# Patient Record
Sex: Female | Born: 1955 | Race: White | Hispanic: No | Marital: Married | Smoking: Former smoker
Health system: Southern US, Community
[De-identification: ages and names within clinical notes are randomized; demographics above are authoritative.]

## PROBLEM LIST (undated history)

## (undated) DIAGNOSIS — E119 Type 2 diabetes mellitus without complications: Secondary | ICD-10-CM

## (undated) HISTORY — DX: Type 2 diabetes mellitus without complications: E11.9

---

## 2018-04-16 ENCOUNTER — Ambulatory Visit (HOSPITAL_COMMUNITY)
Admission: EM | Admit: 2018-04-16 | Discharge: 2018-04-16 | Disposition: A | Discharge status: Home / Self Care | Payer: 59 | Attending: Family Medicine | Admitting: Family Medicine

## 2018-04-16 ENCOUNTER — Encounter (HOSPITAL_COMMUNITY): Payer: Self-pay

## 2018-04-16 DIAGNOSIS — J40 Bronchitis, not specified as acute or chronic: Secondary | ICD-10-CM | POA: Diagnosis not present

## 2018-04-16 MED ORDER — PREDNISONE 20 MG PO TABS: ORAL_TABLET | ORAL | 1 refills | Status: DC

## 2018-04-16 MED ORDER — BENZONATATE 100 MG PO CAPS: 100.0000 mg | ORAL_CAPSULE | Freq: Three times a day (TID) | ORAL | 0 refills | Status: DC | PRN

## 2018-04-16 NOTE — ED Provider Notes (Signed)
[facility]    CSN: [id number] Arrival date & time: 04/16/18  1358     History   Chief Complaint Chief Complaint  Patient presents with  . Cough    HPI Danielle Richards is a 62 y.o. female.   This is the initial urgent care visit for this 62 year old woman who has had a dry cough for over a week.  [place] in the [place] when this [place].  She has no history of asthma or smoking.  There is been no fever.     History reviewed. No pertinent past medical history.  There are no active problems to display for this patient.   Past Surgical History:  Procedure Laterality Date  . CESAREAN SECTION      OB History   None      Home Medications    Prior to Admission medications   Medication Sig Start Date End Date Taking? Authorizing Provider  benzonatate (TESSALON) 100 MG capsule Take 1-2 capsules (100-200 mg total) by mouth 3 (three) times daily as needed for cough. 04/16/18   Elvina SidleLauenstein, Alaya Iverson, MD  predniSONE (DELTASONE) 20 MG tablet One daily with food 04/16/18   Elvina SidleLauenstein, Tery Hoeger, MD    Family History Family History  Family history unknown: Yes    Social History Social History   Tobacco Use  . Smoking status: Never Smoker  . Smokeless tobacco: Never Used  Substance Use Topics  . Alcohol use: Yes    Alcohol/week: 1.0 standard drinks    Types: 1 Glasses of wine per week    Comment: occasional   . Drug use: Not Currently     Allergies   Patient has no allergy information on record.   Review of Systems Review of Systems   Physical Exam Triage Vital Signs ED Triage Vitals  Enc Vitals Group     BP 04/16/18 1523 (!) 146/82     Pulse Rate 04/16/18 1523 86     Resp 04/16/18 1523 18     Temp 04/16/18 1523 97.8 F (36.6 C)     Temp Source 04/16/18 1523 Oral     SpO2 04/16/18 1523 97 %     Weight --      Height --      Head Circumference --      Peak Flow --      Pain Score 04/16/18 1524 0     Pain Loc --      Pain  Edu? --      Excl. in GC? --    No data found.  Updated Vital Signs BP (!) 146/82 (BP Location: Right Arm)   Pulse 86   Temp 97.8 F (36.6 C) (Oral)   Resp 18   SpO2 97%   Visual Acuity  Physical Exam  Constitutional: She is oriented to person, place, and time. She appears well-developed and well-nourished.  HENT:  Right Ear: External ear normal.  Left Ear: External ear normal.  Mouth/Throat: Oropharynx is clear and moist.  Eyes: Conjunctivae are normal.  Neck: Normal range of motion. Neck supple.  Cardiovascular: Normal rate, regular rhythm and normal heart sounds.  Pulmonary/Chest: Effort normal and breath sounds normal.  Musculoskeletal: Normal range of motion.  Neurological: She is alert and oriented to person, place, and time.  Skin: Skin is warm and dry.  Xanthoma below eyes  Psychiatric: She has a normal mood and affect. Her behavior is normal.  Nursing note and vitals reviewed.    UC Treatments /  Results  Labs (all labs ordered are listed, but only abnormal results are displayed) Labs Reviewed - No data to display  EKG None  Radiology No results found.  Procedures Procedures (including critical care time)  Medications Ordered in UC Medications - No data to display  Initial Impression / Assessment and Plan / UC Course  I have reviewed the triage vital signs and the nursing notes.  Pertinent labs & imaging results that were available during my care of the patient were reviewed by me and considered in my medical decision making (see chart for details).     Final Clinical Impressions(s) / UC Diagnoses   Final diagnoses:  Bronchitis     Discharge Instructions     The cough should resolve in 2 days with these medications.  Usually, one takes the medicine for 5 days to completely stop the problem    ED Prescriptions    Medication Sig Dispense Auth. Provider   predniSONE (DELTASONE) 20 MG tablet One daily with food 5 tablet Elvina Sidle, MD    benzonatate (TESSALON) 100 MG capsule Take 1-2 capsules (100-200 mg total) by mouth 3 (three) times daily as needed for cough. 21 capsule Elvina Sidle, MD     Controlled Substance Prescriptions [place] Controlled Substance Registry consulted? Not Applicable   Elvina Sidle, MD 04/16/18 1546

## 2018-04-16 NOTE — ED Triage Notes (Signed)
Pt Present dry cough for a week.

## 2018-04-16 NOTE — Discharge Instructions (Signed)
The cough should resolve in 2 days with these medications.  Usually, one takes the medicine for 5 days to completely stop the problem

## 2019-05-16 ENCOUNTER — Encounter: Payer: 59 | Attending: Family Medicine | Admitting: *Deleted

## 2019-05-16 ENCOUNTER — Other Ambulatory Visit: Payer: Self-pay

## 2019-05-16 DIAGNOSIS — E119 Type 2 diabetes mellitus without complications: Secondary | ICD-10-CM | POA: Diagnosis not present

## 2019-05-16 NOTE — Patient Instructions (Signed)
Plan:   Aim for 2 Carb Choices per meal (30 grams) +/- 1 either way   Aim for 0-1 Carbs per snack if hungry   Include protein in moderation with your meals and snacks  Continue reading food labels for Total Carbohydrate of foods  Continue with your activity level by walking or biking for 60 minutes daily as tolerated  Continue checking BG at alternate times per day   We discussed the possible advantages of using a CGM such as Libre or Dexcom in place of your meter  Continue taking medication as directed by MD

## 2019-05-16 NOTE — Progress Notes (Signed)
Diabetes Self-Management Education  Visit Type: First/Initial  Appt. Start Time: 0800 Appt. End Time: 0930  05/16/2019  [name], identified by name and date of birth, is a 63 y.o. female with a diagnosis of Diabetes: Type 2. She is newly diagnosed and is surprised as she has not been ill as an adult and is not on any prescription medications until starting on insulin. She is very active biking and hiking most of her adult life. She typically eats whole grain and natural foods and her beverages do not contain any sugar. She is here to learn more about diabetes and specifically to food guidelines.   ASSESSMENT  There were no vitals taken for this visit. There is no height or weight on file to calculate BMI.  Diabetes Self-Management Education - 05/16/19 0817      Visit Information   Visit Type  First/Initial      Initial Visit   Diabetes Type  Type 2    Are you currently following a meal plan?  No    Are you taking your medications as prescribed?  Yes    Date Diagnosed  02/2019      Health Coping   How would you rate your overall health?  Good      Psychosocial Assessment   Patient Belief/Attitude about Diabetes  Motivated to manage diabetes    Self-care barriers  None    Other persons present  Patient    Patient Concerns  Nutrition/Meal planning;Glycemic Control;Problem Solving    Special Needs  None    Preferred Learning Style  Auditory;Visual;Hands on    Learning Readiness  Change in progress    How often do you need to have someone help you when you read instructions, pamphlets, or other written materials from your doctor or pharmacy?  1 - Never    What is the last grade level you completed in school?  Bachelors, retired      Teacher, musicre-Education Assessment   Patient understands incorporating nutritional management into lifestyle.  Needs Instruction    Patient undertands incorporating physical activity into lifestyle.  Needs Instruction    Patient understands  using medications safely.  Needs Instruction    Patient understands monitoring blood glucose, interpreting and using results  Needs Instruction    Patient understands prevention, detection, and treatment of acute complications.  Needs Instruction    Patient understands prevention, detection, and treatment of chronic complications.  Needs Instruction    Patient understands how to develop strategies to address psychosocial issues.  Needs Instruction    Patient understands how to develop strategies to promote health/change behavior.  Needs Instruction      Complications   Last HgB A1C per patient/outside source  12.6 %   improving since diagnosed an taking insulin   How often do you check your blood sugar?  1-2 times/day    Fasting Blood glucose range (mg/dL)  16-109;[id number];130-179    Postprandial Blood glucose range (mg/dL)  981-191;[id number];180-200    Number of hypoglycemic episodes per month  0    Have you had a dilated eye exam in the past 12 months?  No    Have you had a dental exam in the past 12 months?  Yes    Are you checking your feet?  Yes    How many days per week are you checking your feet?  5      Dietary Intake   Breakfast  eggs with whole grain toast often OR oatmeal with raisins  and nuts OR left over dinner as the AM meal    Lunch  snacks on salmon with soft cheese on crackers OR    Dinner  3 - 4 PM: meat maybe once a week, mostly chicken or fish, mixes of vegetables, quinoa and beans,  OR large salad with toast or protein added    Snack (evening)  6:30 snack of nuts OR quinoa chips OR low carb crackers    Beverage(s)  almond milk      Exercise   Exercise Type  Moderate (swimming / aerobic walking)    How many days per week to you exercise?  4    How many minutes per day do you exercise?  90    Total minutes per week of exercise  360      Patient Education   Previous Diabetes Education  No    Disease state   Definition of diabetes, type 1 and 2, and the diagnosis of diabetes;Factors  that contribute to the development of diabetes    Nutrition management   Role of diet in the treatment of diabetes and the relationship between the three main macronutrients and blood glucose level;Food label reading, portion sizes and measuring food.;Carbohydrate counting;Reviewed blood glucose goals for pre and post meals and how to evaluate the patients' food intake on their blood glucose level.    Physical activity and exercise   Helped patient identify appropriate exercises in relation to his/her diabetes, diabetes complications and other health issue.;Role of exercise on diabetes management, blood pressure control and cardiac health.    Medications  Reviewed patients medication for diabetes, action, purpose, timing of dose and side effects.    Chronic complications  Relationship between chronic complications and blood glucose control    Psychosocial adjustment  Role of stress on diabetes      Individualized Goals (developed by patient)   Nutrition  Follow meal plan discussed    Physical Activity  Exercise 3-5 times per week    Medications  take my medication as prescribed    Monitoring   test blood glucose pre and post meals as discussed      Post-Education Assessment   Patient understands the diabetes disease and treatment process.  Demonstrates understanding / competency    Patient understands incorporating nutritional management into lifestyle.  Demonstrates understanding / competency    Patient undertands incorporating physical activity into lifestyle.  Demonstrates understanding / competency    Patient understands using medications safely.  Demonstrates understanding / competency    Patient understands monitoring blood glucose, interpreting and using results  Demonstrates understanding / competency    Patient understands prevention, detection, and treatment of acute complications.  Demonstrates understanding / competency    Patient understands prevention, detection, and treatment of  chronic complications.  Demonstrates understanding / competency    Patient understands how to develop strategies to address psychosocial issues.  Demonstrates understanding / competency    Patient understands how to develop strategies to promote health/change behavior.  Demonstrates understanding / competency      Outcomes   Expected Outcomes  Demonstrated interest in learning. Expect positive outcomes    Future DMSE  PRN    Program Status  Not Completed       Individualized Plan for Diabetes Self-Management Training:   Learning Objective:  Patient will have a greater understanding of diabetes self-management. Patient education plan is to attend individual and/or group sessions per assessed needs and concerns.   Plan:   Patient Instructions  Plan:  Aim for 2 Carb Choices per meal (30 grams) +/- 1 either way   Aim for 0-1 Carbs per snack if hungry   Include protein in moderation with your meals and snacks  Continue reading food labels for Total Carbohydrate of foods  Continue with your activity level by walking or biking for 60 minutes daily as tolerated  Continue checking BG at alternate times per day   We discussed the possible advantages of using a CGM such as Libre or Dexcom in place of your meter  Continue taking medication as directed by MD  Expected Outcomes:  Demonstrated interest in learning. Expect positive outcomes  Education material provided: Food label handouts, A1C conversion sheet, Meal plan card and Carbohydrate counting sheet  If problems or questions, patient to contact team via:  Phone  Future DSME appointment: PRN

## 2019-10-04 ENCOUNTER — Telehealth: Payer: Self-pay | Admitting: Hematology

## 2019-10-04 NOTE — Telephone Encounter (Signed)
Received a new hem referral from Dr. Talmage Nap for polycthemia. Danielle Richards has been cld and scheduled to see Dr. Mosetta Putt on 5/25 at 8am. Pt aware to arrive 15 minutes early.

## 2019-10-05 NOTE — Progress Notes (Signed)
[facility]   Telephone:([phone]) [phone] [phone]) [phone]   Clinic New Consult Note   Patient Care Team: Tally Joe, MD as PCP - General (Family Medicine)  Date of Service:  10/09/2019   CHIEF COMPLAINTS/PURPOSE OF CONSULTATION:  Mild erythrocytosis  REFERRING PHYSICIAN:  Dr Talmage Nap  HISTORY OF PRESENTING ILLNESS:  Danielle Richards 64 y.o. female is a here because of slightly elevated hemoglobin and hematocrit on routine lab. The patient was referred by Dr Talmage Nap The patient presents to the clinic today alone.  She notes she has labs checked as needed in recent years. She was seeing Dr Talmage Nap for her DM. Her recent labs show elevated red blood counts. She notes her last period was when she was 64 years old and she was anemic with her pregnancy in the past.  Today she denies any issues with her chest, breathing, abdomen, eating, or Bowel movement. She denies itching of skin after hot shower and will get redness of her cheeks randomly, not daily. She denies headaches or vision issues. She notes being mildly fatigued lately. She notes stress, but due to dealing with her deceased mother's estate.   Socially she moved to [place] from [place] where she lived for 9 years.  Her older records would be with her PCP and Dr Talmage Nap. She is married with 4 children. She had 2 miscarriages in 2nd trimester. She is retired from Consulting civil engineer. She has other family who live in [place]. She drinks rarely. She smoked for 5 years a few cigarettes a week.   They have a PMHx of Hepatitis A in her 38s. She denies being tested for Hep B or C. She also has DM. She only had a C-section. She denies family history of blood disorders. Her mother had lung cancer and her father had skin melanoma. She notes her mother had blood counts in her legs and required IVC Filter.    REVIEW OF SYSTEMS:   Constitutional: Denies fevers, chills or abnormal night sweats Eyes: Denies blurriness of vision, double vision or watery eyes  Ears, nose, mouth, throat, and face: Denies mucositis or sore throat Respiratory: Denies cough, dyspnea or wheezes Cardiovascular: Denies palpitation, chest discomfort or lower extremity swelling Gastrointestinal:  Denies nausea, heartburn or change in bowel habits Skin: Denies abnormal skin rashes Lymphatics: Denies new lymphadenopathy or easy bruising Neurological:Denies numbness, tingling or new weaknesses Behavioral/Psych: Richards is stable, no new changes  All other systems were reviewed with the patient and are negative.   MEDICAL HISTORY:  Past Medical History:  Diagnosis Date  . Diabetes mellitus without complication (HCC)     SURGICAL HISTORY: Past Surgical History:  Procedure Laterality Date  . CESAREAN SECTION      SOCIAL HISTORY: Social History   Socioeconomic History  . Marital status: Married    Spouse name: Not on file  . Number of children: 4  . Years of education: Not on file  . Highest education level: Not on file  Occupational History  . Occupation: retired Consulting civil engineer   Tobacco Use  . Smoking status: Former Smoker    Packs/day: 0.25    Years: 5.00    Pack years: 1.25  . Smokeless tobacco: Never Used  Substance and Sexual Activity  . Alcohol use: Yes    Alcohol/week: 1.0 standard drinks    Types: 1 Glasses of wine per week    Comment: occasional   . Drug use: Not Currently  . Sexual activity: Yes  Other Topics Concern  . Not  on file  Social History Narrative  . Not on file   Social Determinants of Health   Financial Resource Strain:   . Difficulty of Paying Living Expenses:   Food Insecurity:   . Worried About Programme researcher, broadcasting/film/video in the Last Year:   . Barista in the Last Year:   Transportation Needs:   . Freight forwarder (Medical):   Marland Kitchen Lack of Transportation (Non-Medical):   Physical Activity:   . Days of Exercise per Week:   . Minutes of Exercise per Session:   Stress:   . Feeling of Stress :   Social Connections:   .  Frequency of Communication with Friends and Family:   . Frequency of Social Gatherings with Friends and Family:   . Attends Religious Services:   . Active Member of Clubs or Organizations:   . Attends Banker Meetings:   Marland Kitchen Marital Status:   Intimate Partner Violence:   . Fear of Current or Ex-Partner:   . Emotionally Abused:   Marland Kitchen Physically Abused:   . Sexually Abused:     FAMILY HISTORY: Family History  Problem Relation Age of Onset  . Cancer Mother        lung cancer  . Cancer Father        melanoma     ALLERGIES:  has No Known Allergies.  MEDICATIONS:  Current Outpatient Medications  Medication Sig Dispense Refill  . metFORMIN (GLUCOPHAGE-XR) 500 MG 24 hr tablet Take 500 mg by mouth 2 (two) times daily.    . Vitamin D, Ergocalciferol, (DRISDOL) 1.25 MG (50000 UNIT) CAPS capsule Take 50,000 Units by mouth at bedtime.     No current facility-administered medications for this visit.    PHYSICAL EXAMINATION: ECOG PERFORMANCE STATUS: 0 - Asymptomatic  Vitals:   10/09/19 0824 10/09/19 0826  BP: (!) 148/83 136/87  Pulse: 72   Resp: 18   Temp: 97.7 F (36.5 C)   SpO2: 100%    Filed Weights   10/09/19 0824  Weight: 165 lb 11.2 oz (75.2 kg)    GENERAL:alert, no distress and comfortable SKIN: skin color, texture, turgor are normal, no rashes or significant lesions EYES: normal, Conjunctiva are pink and non-injected, sclera clear  NECK: supple, thyroid normal size, non-tender, without nodularity LYMPH:  no palpable lymphadenopathy in the cervical, axillary  LUNGS: clear to auscultation and percussion with normal breathing effort HEART: regular rate & rhythm and no murmurs and no lower extremity edema ABDOMEN:abdomen soft, non-tender and normal bowel sounds. No organomegaly  Musculoskeletal:no cyanosis of digits and no clubbing  NEURO: alert & oriented x 3 with fluent speech, no focal motor/sensory deficits  LABORATORY DATA:  I have reviewed the data as  listed CBC Latest Ref Rng & Units 10/09/2019  WBC 4.0 - 10.5 K/uL 4.2  Hemoglobin 12.0 - 15.0 g/dL 15.1  Hematocrit 76.1 - 46.0 % 45.0  Platelets 150 - 400 K/uL 256    No flowsheet data found.   OUTSIDE LABS:   04/10/19  WBC 3.7 Hg 15.9 Hct 47.2 MCV 89.9 MCH 30.3 MCHC 33.7 plt 229K   08/28/19 WBC 4.5 HG 15.4 HCT 45.6  MCV 91.4 MCH 30.9 MCHC 33.8 PLT 251k   RADIOGRAPHIC STUDIES: I have personally reviewed the radiological images as listed and agreed with the findings in the report. No results found.  ASSESSMENT & PLAN:  Makinley Muscato is a 64 y.o. Caucasian female with a history of DM, Anxiety, Depression, Vit D  Deficiency.   1. Mild erythrocytosis, rule out polycythemia -Her labs from 03/2019 and 08/2019 show elevated Hg in 15.4-15.9 range and Hct 45-47%.  -I discussed with elevated red blood counts, polycythemia is probable although her H/H are not very high.  We discussed that the polycythemia is a benign blood disorder with thickening of the blood which can increase her rick of thrombosis including DVT, PE, MI or stroke.  -We reviewed the diagnosis of polycythemia vera, versus secondary polycythemia.  She does not have clinical suspicion for secondary polycythemia -I discussed treatment may include Phlebotomy to physically remove the blood or medicine to decrease bone marrow production of her blood cells directly such as Hydrea.  -I will repeat her CBC today, and also check erythropoietin level, and Jak2 mutations. Given her mild elevated levels, my suspicion for polycythemia vera is low.  If her erythropoietin level is normal, JAK2 mutation negative, this will rule out PV.  -I reviewed benefit and risk of daily baby aspirin, which I do not recommend if she does not have PV. -Physical exam unremarkable today, no splenomegaly.  -f/u as needed   2. DM -She is being seen by Dr Talmage Nap for management    3. Cancer screenings  -I encouraged her to continue age  appropriate cancer screenings. She has family hx of lung cancer and melanoma.  -She notes she has been doing mammograms every 3 years, Pap smear every 5 years and she has done cologuard, no colonoscopy.  -I recommend she does yearly mammograms and have at least 1 colonoscopy given her mother's h/o polyps. She is agreed. I will order mammogram to be done.  -Given she is postmenopausal I recommend Bone Density testing every few years. She can f/u with Dr Opal Sidles for this.    PLAN:  -Mammogram in 1-2 months  -Lab today for CBC, erythropoietin, and JAK2 mutation.  I will call her with lab results.  If the lab test are negative, no need to follow-up with me.   Orders Placed This Encounter  Procedures  . MM Digital Screening    Standing Status:   Future    Standing Expiration Date:   10/08/2020    Order Specific Question:   Reason for Exam (SYMPTOM  OR DIAGNOSIS REQUIRED)    Answer:   screening    Order Specific Question:   Preferred imaging location?    Answer:   [facility]  . CBC with Differential (Cancer Center Only)    Standing Status:   Future    Number of Occurrences:   1    Standing Expiration Date:   10/08/2020  . Erythropoietin    Standing Status:   Future    Number of Occurrences:   1    Standing Expiration Date:   10/08/2020  . Jak 2 Exon 12 ([facility])    Standing Status:   Future    Number of Occurrences:   1    Standing Expiration Date:   10/08/2020  . Jak 2 V617F ([facility])    Standing Status:   Future    Number of Occurrences:   1    Standing Expiration Date:   10/08/2020    All questions were answered. The patient knows to call the clinic with any problems, questions or concerns. The total time spent in the appointment was 35 minutes.     Danielle Mood, MD 10/09/2019 2:43 PM  I, Delphina Cahill, am acting as scribe for Danielle Mood, MD.   I have reviewed the above  documentation for accuracy and completeness, and I agree with the above.

## 2019-10-09 ENCOUNTER — Other Ambulatory Visit: Payer: Self-pay

## 2019-10-09 ENCOUNTER — Inpatient Hospital Stay: Payer: 59

## 2019-10-09 ENCOUNTER — Encounter: Payer: Self-pay | Admitting: Hematology

## 2019-10-09 ENCOUNTER — Inpatient Hospital Stay: Payer: 59 | Attending: Hematology | Admitting: Hematology

## 2019-10-09 VITALS — BP 136/87 | HR 72 | Temp 97.7°F | Resp 18 | Ht 66.0 in | Wt 165.7 lb

## 2019-10-09 DIAGNOSIS — Z808 Family history of malignant neoplasm of other organs or systems: Secondary | ICD-10-CM | POA: Insufficient documentation

## 2019-10-09 DIAGNOSIS — E119 Type 2 diabetes mellitus without complications: Secondary | ICD-10-CM | POA: Insufficient documentation

## 2019-10-09 DIAGNOSIS — D751 Secondary polycythemia: Secondary | ICD-10-CM | POA: Diagnosis not present

## 2019-10-09 DIAGNOSIS — Z87891 Personal history of nicotine dependence: Secondary | ICD-10-CM | POA: Diagnosis not present

## 2019-10-09 DIAGNOSIS — R5383 Other fatigue: Secondary | ICD-10-CM | POA: Insufficient documentation

## 2019-10-09 DIAGNOSIS — F418 Other specified anxiety disorders: Secondary | ICD-10-CM | POA: Insufficient documentation

## 2019-10-09 DIAGNOSIS — Z1231 Encounter for screening mammogram for malignant neoplasm of breast: Secondary | ICD-10-CM

## 2019-10-09 DIAGNOSIS — Z801 Family history of malignant neoplasm of trachea, bronchus and lung: Secondary | ICD-10-CM | POA: Diagnosis not present

## 2019-10-09 DIAGNOSIS — Z7984 Long term (current) use of oral hypoglycemic drugs: Secondary | ICD-10-CM | POA: Insufficient documentation

## 2019-10-09 DIAGNOSIS — E559 Vitamin D deficiency, unspecified: Secondary | ICD-10-CM | POA: Insufficient documentation

## 2019-10-09 LAB — CBC WITH DIFFERENTIAL (CANCER CENTER ONLY)
Abs Immature Granulocytes: 0.01 10*3/uL (ref 0.00–0.07)
Basophils Absolute: 0 10*3/uL (ref 0.0–0.1)
Basophils Relative: 1 %
Eosinophils Absolute: 0.1 10*3/uL (ref 0.0–0.5)
Eosinophils Relative: 3 %
HCT: 45 % (ref 36.0–46.0)
Hemoglobin: 14.9 g/dL (ref 12.0–15.0)
Immature Granulocytes: 0 %
Lymphocytes Relative: 36 %
Lymphs Abs: 1.5 10*3/uL (ref 0.7–4.0)
MCH: 30.7 pg (ref 26.0–34.0)
MCHC: 33.1 g/dL (ref 30.0–36.0)
MCV: 92.6 fL (ref 80.0–100.0)
Monocytes Absolute: 0.3 10*3/uL (ref 0.1–1.0)
Monocytes Relative: 8 %
Neutro Abs: 2.2 10*3/uL (ref 1.7–7.7)
Neutrophils Relative %: 52 %
Platelet Count: 256 10*3/uL (ref 150–400)
RBC: 4.86 MIL/uL (ref 3.87–5.11)
RDW: 11.6 % (ref 11.5–15.5)
WBC Count: 4.2 10*3/uL (ref 4.0–10.5)
nRBC: 0 % (ref 0.0–0.2)

## 2019-10-10 LAB — ERYTHROPOIETIN: Erythropoietin: 5.3 m[IU]/mL (ref 2.6–18.5)

## 2019-10-25 LAB — JAK2 (INCLUDING V617F AND EXON 12), MPL,& CALR W/RFL MPN PANEL (NGS)

## 2019-11-05 ENCOUNTER — Telehealth: Payer: Self-pay | Admitting: Emergency Medicine

## 2019-11-05 NOTE — Telephone Encounter (Addendum)
VM left for pt to call back   ----- Message from Malachy Mood, MD sent at 11/04/2019  8:02 AM EDT ----- Please let pt know her lab were normal, no concern for blood disorders, I will see her as needed in future. Sorry that it took a while to call her back.   Malachy Mood  11/04/2019

## 2019-11-06 ENCOUNTER — Telehealth: Payer: Self-pay | Admitting: Emergency Medicine

## 2019-11-06 NOTE — Telephone Encounter (Signed)
Previous note from dr.feng given to pt. Pt verbalized understanding of lab results.

## 2019-12-05 ENCOUNTER — Other Ambulatory Visit: Payer: Self-pay

## 2019-12-05 ENCOUNTER — Ambulatory Visit
Admission: RE | Admit: 2019-12-05 | Discharge: 2019-12-05 | Disposition: A | Discharge status: Home / Self Care | Payer: 59 | Source: Ambulatory Visit | Attending: Hematology | Admitting: Hematology

## 2019-12-05 DIAGNOSIS — Z1231 Encounter for screening mammogram for malignant neoplasm of breast: Secondary | ICD-10-CM

## 2020-03-05 ENCOUNTER — Other Ambulatory Visit: Payer: Self-pay | Admitting: Family Medicine

## 2020-03-05 DIAGNOSIS — M25511 Pain in right shoulder: Secondary | ICD-10-CM

## 2020-12-05 ENCOUNTER — Other Ambulatory Visit: Payer: Self-pay | Admitting: Family Medicine

## 2020-12-05 DIAGNOSIS — Z1231 Encounter for screening mammogram for malignant neoplasm of breast: Secondary | ICD-10-CM

## 2020-12-12 ENCOUNTER — Other Ambulatory Visit: Payer: Self-pay

## 2020-12-12 ENCOUNTER — Ambulatory Visit
Admission: RE | Admit: 2020-12-12 | Discharge: 2020-12-12 | Disposition: A | Discharge status: Home / Self Care | Payer: 59 | Source: Ambulatory Visit | Attending: Family Medicine | Admitting: Family Medicine

## 2020-12-12 DIAGNOSIS — Z1231 Encounter for screening mammogram for malignant neoplasm of breast: Secondary | ICD-10-CM

## 2021-03-20 ENCOUNTER — Other Ambulatory Visit: Payer: Self-pay | Admitting: Family Medicine

## 2021-03-20 DIAGNOSIS — E2839 Other primary ovarian failure: Secondary | ICD-10-CM

## 2021-06-12 IMAGING — MG DIGITAL SCREENING BILAT W/ CAD
4 series · 4 of 4 positions shown · non-contrast
Comparison: Previous exam(s).

CLINICAL DATA: Screening.

EXAM:
DIGITAL SCREENING BILATERAL MAMMOGRAM WITH CAD

[L MLO]
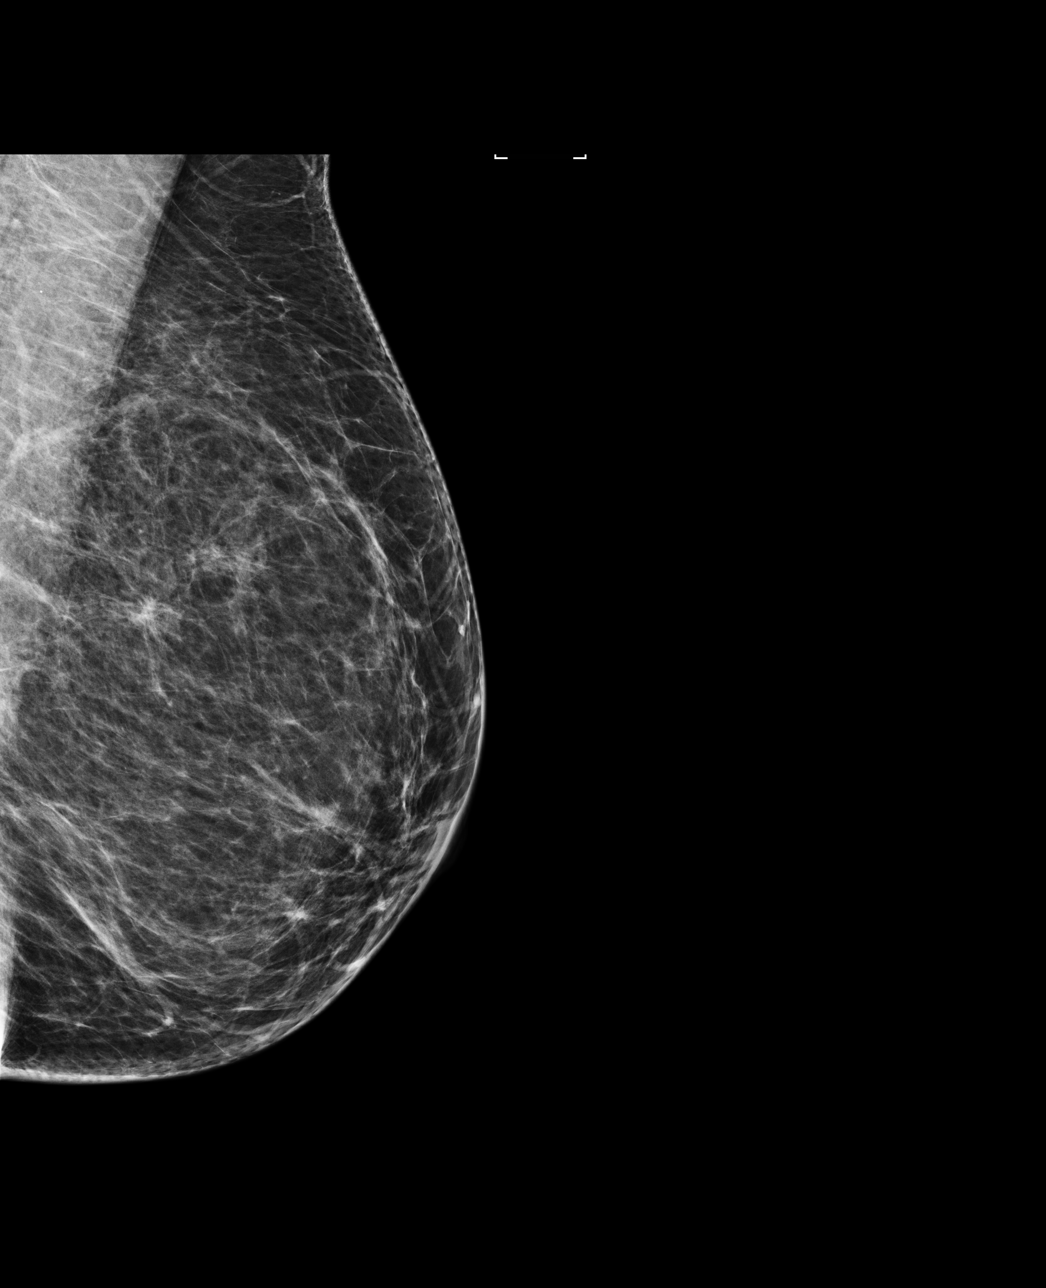

[R MLO]
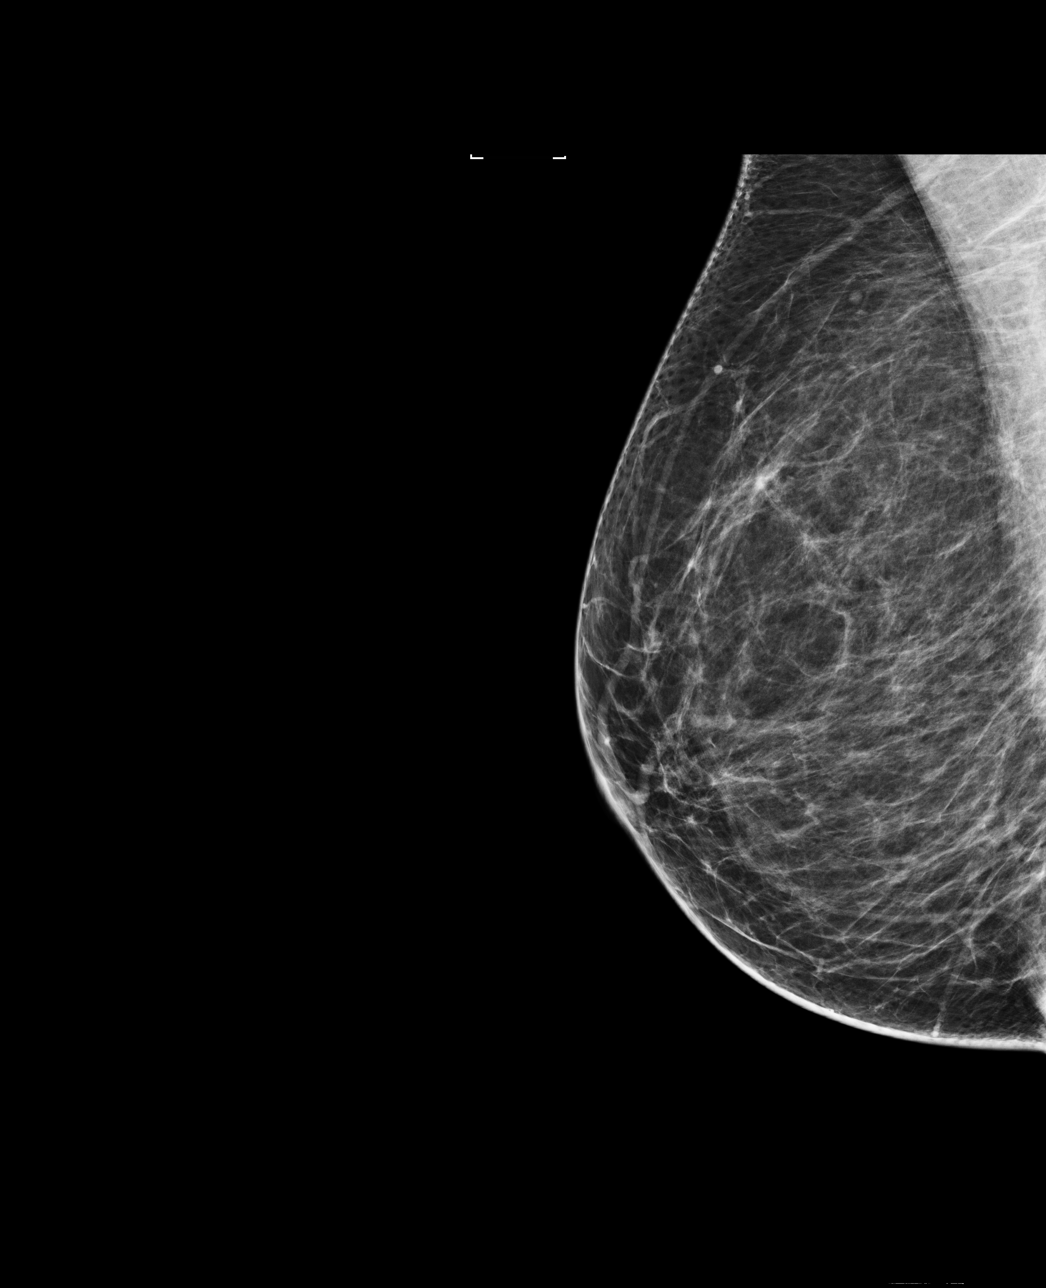

[R CC]
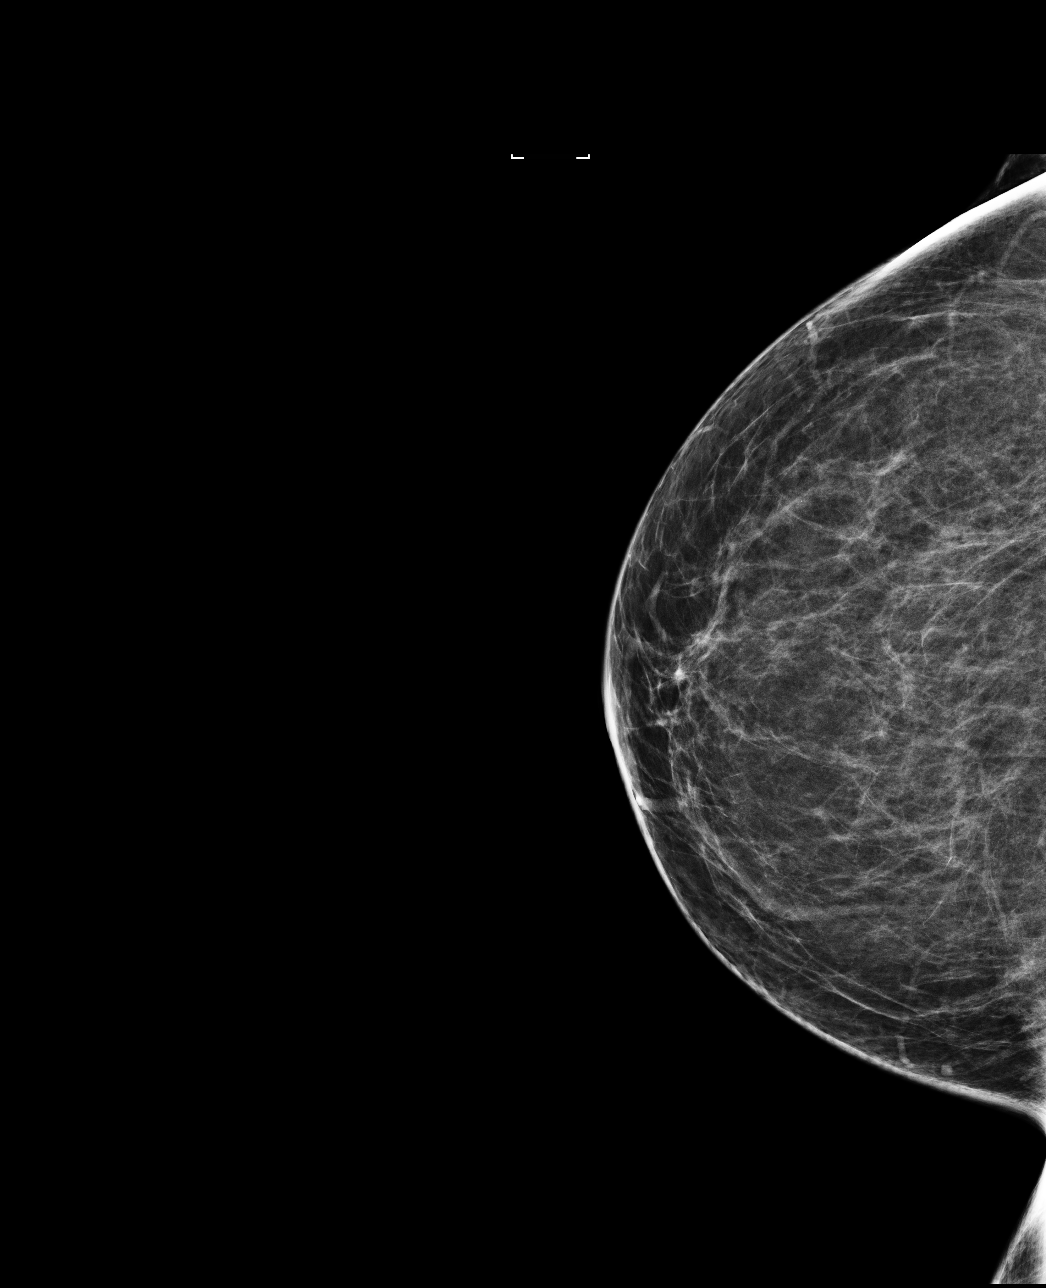

[L CC]
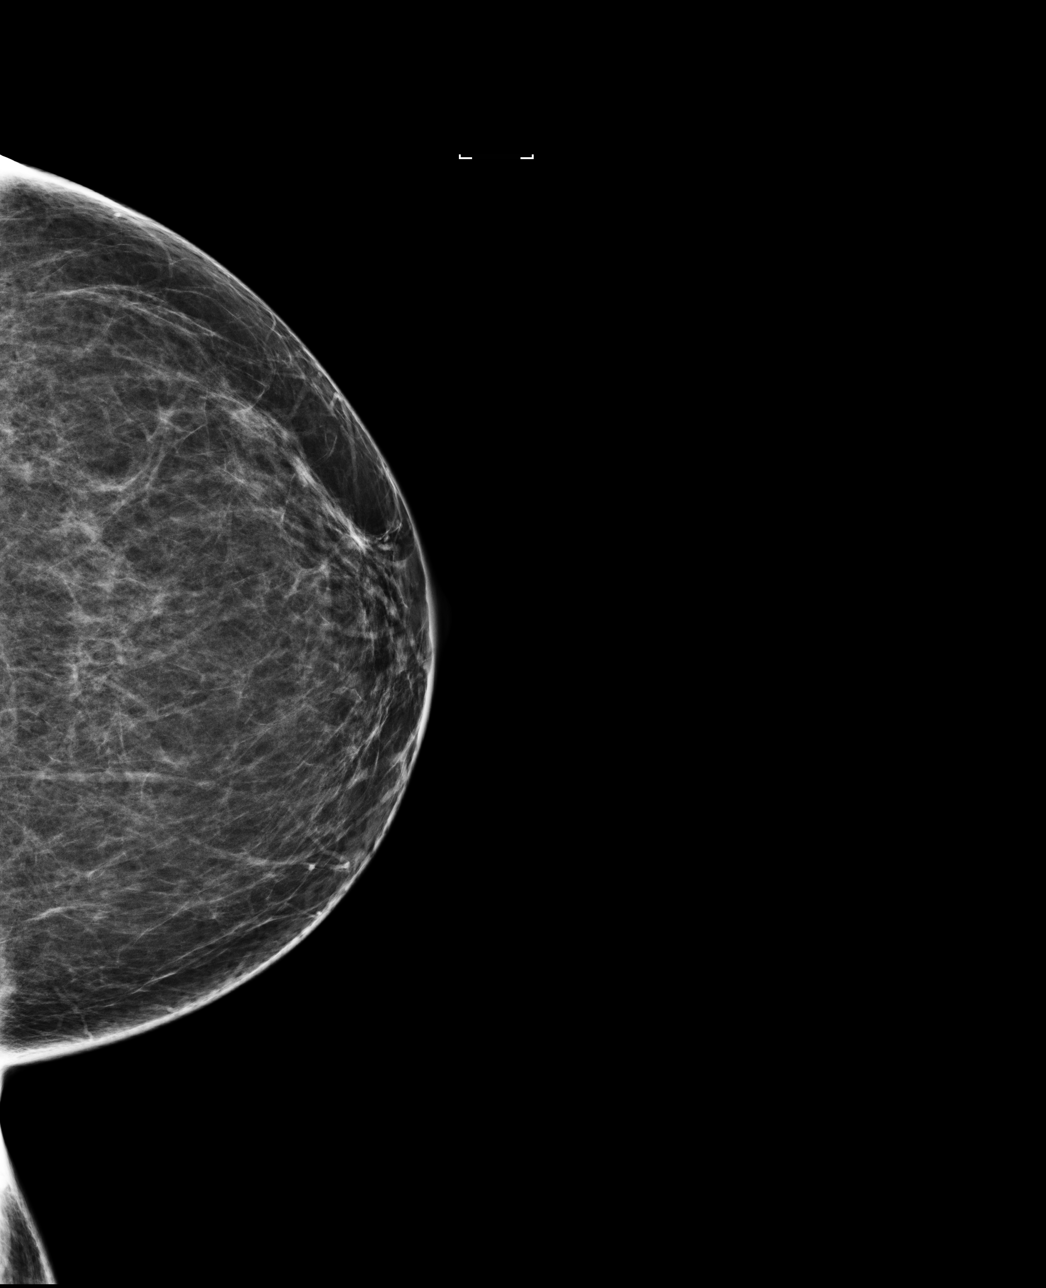

[4 of 4 positions shown; findings below may reference images not displayed]

ACR Breast Density Category b: There are scattered areas of
fibroglandular density.
FINDINGS: There are no findings suspicious for malignancy. Images were
processed with CAD.
IMPRESSION: No mammographic evidence of malignancy. A result letter of this
screening mammogram will be mailed directly to the patient.

RECOMMENDATION:
Screening mammogram in one year. (Code:AS-G-LCT)

BI-RADS CATEGORY  1: Negative.

## 2021-08-19 ENCOUNTER — Ambulatory Visit
Admission: RE | Admit: 2021-08-19 | Discharge: 2021-08-19 | Disposition: A | Discharge status: Home / Self Care | Payer: 59 | Source: Ambulatory Visit | Attending: Family Medicine | Admitting: Family Medicine

## 2021-08-19 DIAGNOSIS — E2839 Other primary ovarian failure: Secondary | ICD-10-CM

## 2021-12-02 ENCOUNTER — Other Ambulatory Visit: Payer: Self-pay | Admitting: Family Medicine

## 2021-12-02 DIAGNOSIS — Z1231 Encounter for screening mammogram for malignant neoplasm of breast: Secondary | ICD-10-CM

## 2021-12-15 ENCOUNTER — Ambulatory Visit
Admission: RE | Admit: 2021-12-15 | Discharge: 2021-12-15 | Disposition: A | Discharge status: Home / Self Care | Payer: 59 | Source: Ambulatory Visit | Attending: Family Medicine | Admitting: Family Medicine

## 2021-12-15 DIAGNOSIS — Z1231 Encounter for screening mammogram for malignant neoplasm of breast: Secondary | ICD-10-CM

## 2022-05-13 ENCOUNTER — Ambulatory Visit
Admission: EM | Admit: 2022-05-13 | Discharge: 2022-05-13 | Disposition: A | Discharge status: Home / Self Care | Payer: 59

## 2022-05-13 DIAGNOSIS — H6123 Impacted cerumen, bilateral: Secondary | ICD-10-CM | POA: Diagnosis not present

## 2022-05-13 MED ORDER — CARBAMIDE PEROXIDE 6.5 % OT SOLN: 5.0000 [drp] | Freq: Every day | OTIC | 0 refills | Status: AC | PRN

## 2022-05-13 NOTE — ED Triage Notes (Signed)
Pt states she can not hear out of left ear x 2 days-NAD-steady gait

## 2022-05-13 NOTE — ED Provider Notes (Signed)
[facility]  Note:  This document was prepared using Conservation officer, historic buildings and may include unintentional dictation errors.  MRN: [id number] DOB: 01/06/1956  Subjective:   Danielle Richards is a 66 y.o. female presenting for 2-day history of acute onset persistent decreased hearing of the left ear.  No fever, nausea, vomiting, tinnitus, dizziness, runny or stuffy nose, ear pain, ear drainage.  No Q-tip use.  No current facility-administered medications for this encounter.  Current Outpatient Medications:    MOUNJARO 2.5 MG/0.5ML Pen, Inject into the skin., Disp: , Rfl:    metFORMIN (GLUCOPHAGE-XR) 500 MG 24 hr tablet, Take 500 mg by mouth 2 (two) times daily., Disp: , Rfl:    Vitamin D, Ergocalciferol, (DRISDOL) 1.25 MG (50000 UNIT) CAPS capsule, Take 50,000 Units by mouth at bedtime., Disp: , Rfl:    No Known Allergies  Past Medical History:  Diagnosis Date   Diabetes mellitus without complication (HCC)      Past Surgical History:  Procedure Laterality Date   CESAREAN SECTION      Family History  Problem Relation Age of Onset   Cancer Mother        lung cancer   Cancer Father        melanoma     Social History   Tobacco Use   Smoking status: Former    Packs/day: 0.25    Years: 5.00    Total pack years: 1.25    Types: Cigarettes   Smokeless tobacco: Never  Vaping Use   Vaping Use: Never used  Substance Use Topics   Alcohol use: Yes    Alcohol/week: 1.0 standard drink of alcohol    Types: 1 Glasses of wine per week    Comment: occasional    Drug use: Not Currently    ROS   Objective:   Vitals: BP (!) 143/90 (BP Location: Right Arm)   Pulse 75   Temp 98.3 F (36.8 C) (Oral)   Resp 18   SpO2 97%   Physical Exam Constitutional:      General: She is not in acute distress.    Appearance: Normal appearance. She is well-developed. She is not ill-appearing, toxic-appearing or diaphoretic.  HENT:     Head:  Normocephalic and atraumatic.     Right Ear: Tympanic membrane, ear canal and external ear normal. No tenderness. There is impacted cerumen. Tympanic membrane is not injected, perforated, erythematous or bulging.     Left Ear: Tympanic membrane, ear canal and external ear normal. No tenderness. There is impacted cerumen. Tympanic membrane is not injected, perforated, erythematous or bulging.     Nose: Nose normal.     Mouth/Throat:     Mouth: Mucous membranes are moist.  Eyes:     General: No scleral icterus.       Right eye: No discharge.        Left eye: No discharge.     Extraocular Movements: Extraocular movements intact.  Cardiovascular:     Rate and Rhythm: Normal rate.  Pulmonary:     Effort: Pulmonary effort is normal.  Skin:    General: Skin is warm and dry.  Neurological:     General: No focal deficit present.     Mental Status: She is alert and oriented to person, place, and time.  Psychiatric:        Mood and Affect: Mood normal.        Behavior: Behavior normal.    Ear lavage performed  using mixture of peroxide and water.  Pressure irrigation performed using a bottle and a thin ear tube.  Bilateral ear lavage.  No curette was used.   Assessment and Plan :   PDMP not reviewed this encounter.  1. Bilateral impacted cerumen     Successful bilateral ear lavage.  General management of cerumen impaction reviewed with patient.  Anticipatory guidance provided. Counseled patient on potential for adverse effects with medications prescribed/recommended today, ER and return-to-clinic precautions discussed, patient verbalized understanding.     Wallis Bamberg, PA-C 05/13/22 1355

## 2022-08-30 ENCOUNTER — Other Ambulatory Visit (HOSPITAL_COMMUNITY): Payer: Self-pay

## 2022-08-31 ENCOUNTER — Other Ambulatory Visit (HOSPITAL_COMMUNITY): Payer: Self-pay

## 2022-08-31 MED ORDER — MOUNJARO 2.5 MG/0.5ML ~~LOC~~ SOAJ
2.5000 mg | SUBCUTANEOUS | 5 refills | Status: AC
  Filled 2022-08-31: qty 4, 56d supply, fill #0
  Filled 2022-08-31: qty 2, 28d supply, fill #0
  Filled 2022-10-14: qty 2, 28d supply, fill #1
  Filled 2022-11-10: qty 2, 28d supply, fill #2
  Filled 2022-12-15 – 2022-12-16 (×3): qty 2, 28d supply, fill #3
  Filled 2022-12-16 – 2023-01-13 (×2): qty 2, 28d supply, fill #4
  Filled 2023-02-13: qty 2, 28d supply, fill #5
  Filled 2023-03-15: qty 2, 28d supply, fill #6
  Filled 2023-04-12 – 2023-05-10 (×2): qty 2, 28d supply, fill #7
  Filled 2023-07-14: qty 2, 28d supply, fill #8
  Filled 2023-08-17: qty 2, 28d supply, fill #9

## 2022-09-24 ENCOUNTER — Other Ambulatory Visit (HOSPITAL_BASED_OUTPATIENT_CLINIC_OR_DEPARTMENT_OTHER): Payer: Self-pay

## 2022-11-03 ENCOUNTER — Other Ambulatory Visit: Payer: Self-pay | Admitting: Family Medicine

## 2022-11-03 DIAGNOSIS — Z1231 Encounter for screening mammogram for malignant neoplasm of breast: Secondary | ICD-10-CM

## 2022-11-11 ENCOUNTER — Other Ambulatory Visit (HOSPITAL_COMMUNITY): Payer: Self-pay

## 2022-12-16 ENCOUNTER — Other Ambulatory Visit (HOSPITAL_COMMUNITY): Payer: Self-pay

## 2022-12-20 ENCOUNTER — Ambulatory Visit: Admission: RE | Admit: 2022-12-20 | Payer: 59 | Source: Ambulatory Visit

## 2022-12-20 DIAGNOSIS — Z1231 Encounter for screening mammogram for malignant neoplasm of breast: Secondary | ICD-10-CM

## 2022-12-24 ENCOUNTER — Other Ambulatory Visit (HOSPITAL_COMMUNITY): Payer: Self-pay

## 2023-01-15 ENCOUNTER — Other Ambulatory Visit (HOSPITAL_COMMUNITY): Payer: Self-pay

## 2023-01-27 ENCOUNTER — Other Ambulatory Visit (HOSPITAL_COMMUNITY): Payer: Self-pay

## 2023-02-15 ENCOUNTER — Other Ambulatory Visit (HOSPITAL_COMMUNITY): Payer: Self-pay

## 2023-03-15 ENCOUNTER — Other Ambulatory Visit (HOSPITAL_COMMUNITY): Payer: Self-pay

## 2023-04-12 ENCOUNTER — Other Ambulatory Visit (HOSPITAL_COMMUNITY): Payer: Self-pay

## 2023-04-12 MED ORDER — MOUNJARO 2.5 MG/0.5ML ~~LOC~~ SOAJ
2.5000 mg | SUBCUTANEOUS | 11 refills | Status: AC
  Filled 2023-04-12: qty 2, 28d supply, fill #0
  Filled 2023-06-16: qty 2, 28d supply, fill #1
  Filled 2023-09-17: qty 2, 28d supply, fill #2
  Filled 2023-10-18: qty 2, 28d supply, fill #3
  Filled 2023-11-13: qty 2, 28d supply, fill #4
  Filled 2023-12-14: qty 2, 28d supply, fill #5
  Filled 2024-01-12: qty 2, 28d supply, fill #6
  Filled 2024-02-10: qty 2, 28d supply, fill #7
  Filled 2024-03-12: qty 2, 28d supply, fill #8
  Filled 2024-04-10: qty 2, 28d supply, fill #9

## 2023-04-15 ENCOUNTER — Other Ambulatory Visit (HOSPITAL_COMMUNITY): Payer: Self-pay

## 2023-05-12 ENCOUNTER — Other Ambulatory Visit (HOSPITAL_COMMUNITY): Payer: Self-pay

## 2023-06-16 ENCOUNTER — Other Ambulatory Visit (HOSPITAL_COMMUNITY): Payer: Self-pay

## 2023-11-08 ENCOUNTER — Other Ambulatory Visit: Payer: Self-pay | Admitting: Family Medicine

## 2023-11-08 DIAGNOSIS — Z1231 Encounter for screening mammogram for malignant neoplasm of breast: Secondary | ICD-10-CM

## 2023-12-22 ENCOUNTER — Ambulatory Visit
Admission: RE | Admit: 2023-12-22 | Discharge: 2023-12-22 | Disposition: A | Discharge status: Home / Self Care | Source: Ambulatory Visit | Attending: Family Medicine | Admitting: Family Medicine

## 2023-12-22 DIAGNOSIS — Z1231 Encounter for screening mammogram for malignant neoplasm of breast: Secondary | ICD-10-CM

## 2024-01-12 ENCOUNTER — Other Ambulatory Visit: Payer: Self-pay | Admitting: Medical Genetics

## 2024-01-13 ENCOUNTER — Other Ambulatory Visit (HOSPITAL_COMMUNITY)
Admission: RE | Admit: 2024-01-13 | Discharge: 2024-01-13 | Disposition: A | Discharge status: Home / Self Care | Payer: Self-pay | Source: Ambulatory Visit | Attending: Oncology | Admitting: Oncology

## 2024-01-23 LAB — GENECONNECT MOLECULAR SCREEN: Genetic Analysis Overall Interpretation: NEGATIVE

## 2024-01-25 ENCOUNTER — Other Ambulatory Visit (HOSPITAL_COMMUNITY): Payer: Self-pay

## 2024-01-25 MED ORDER — MOUNJARO 2.5 MG/0.5ML ~~LOC~~ SOAJ
2.5000 mg | SUBCUTANEOUS | 11 refills | Status: AC
  Filled 2024-01-25 – 2024-05-07 (×2): qty 2, 28d supply, fill #0
  Filled 2024-06-05: qty 2, 28d supply, fill #1

## 2024-01-26 ENCOUNTER — Other Ambulatory Visit (HOSPITAL_COMMUNITY): Payer: Self-pay

## 2024-01-31 ENCOUNTER — Other Ambulatory Visit (HOSPITAL_COMMUNITY): Payer: Self-pay

## 2024-01-31 MED ORDER — NEOMYCIN-POLYMYXIN-DEXAMETH 3.5-10000-0.1 OP OINT
TOPICAL_OINTMENT | OPHTHALMIC | 3 refills | Status: AC
  Filled 2024-01-31: qty 3.5, 7d supply, fill #0
  Filled 2024-02-19: qty 3.5, 7d supply, fill #1
  Filled 2024-02-26: qty 3.5, 7d supply, fill #2
  Filled 2024-03-12: qty 3.5, 7d supply, fill #3

## 2024-04-15 ENCOUNTER — Emergency Department (HOSPITAL_COMMUNITY)
Admission: EM | Admit: 2024-04-15 | Discharge: 2024-04-15 | Disposition: A | Discharge status: Home / Self Care | Attending: Emergency Medicine | Admitting: Emergency Medicine

## 2024-04-15 ENCOUNTER — Other Ambulatory Visit: Payer: Self-pay

## 2024-04-15 ENCOUNTER — Other Ambulatory Visit (HOSPITAL_COMMUNITY): Payer: Self-pay

## 2024-04-15 DIAGNOSIS — E119 Type 2 diabetes mellitus without complications: Secondary | ICD-10-CM | POA: Diagnosis not present

## 2024-04-15 DIAGNOSIS — L02416 Cutaneous abscess of left lower limb: Secondary | ICD-10-CM | POA: Diagnosis present

## 2024-04-15 DIAGNOSIS — L03116 Cellulitis of left lower limb: Secondary | ICD-10-CM | POA: Diagnosis not present

## 2024-04-15 DIAGNOSIS — Z7984 Long term (current) use of oral hypoglycemic drugs: Secondary | ICD-10-CM | POA: Diagnosis not present

## 2024-04-15 DIAGNOSIS — Z794 Long term (current) use of insulin: Secondary | ICD-10-CM | POA: Insufficient documentation

## 2024-04-15 DIAGNOSIS — L0291 Cutaneous abscess, unspecified: Secondary | ICD-10-CM

## 2024-04-15 MED ORDER — SULFAMETHOXAZOLE-TRIMETHOPRIM 800-160 MG PO TABS
1.0000 | ORAL_TABLET | Freq: Two times a day (BID) | ORAL | 0 refills | Status: AC
  Filled 2024-04-15: qty 14, 7d supply, fill #0

## 2024-04-15 MED ORDER — SULFAMETHOXAZOLE-TRIMETHOPRIM 800-160 MG PO TABS
1.0000 | ORAL_TABLET | Freq: Once | ORAL | Status: AC
  Administered 2024-04-15: 1 via ORAL
  Filled 2024-04-15: qty 1

## 2024-04-15 MED ORDER — LIDOCAINE-EPINEPHRINE (PF) 2 %-1:200000 IJ SOLN
10.0000 mL | Freq: Once | INTRAMUSCULAR | Status: AC
  Administered 2024-04-15: 10 mL via INTRADERMAL
  Filled 2024-04-15: qty 20

## 2024-04-15 NOTE — Discharge Instructions (Signed)
 Danielle Richards  Thank you for allowing us  to take care of you today.  You came to the Emergency Department today because you had an area of redness and swelling on your left lateral lower leg.  Here in the emergency department there was nothing on physical exam and or ultrasound that appeared to be a insect.  You had some sort of damage to the soft tissue, potentially initially an insect bite, ingrown hair, spontaneous infection, etc., however this developed into a small pocket of infection (abscess) as well as a soft tissue infection (cellulitis).  We opened up the pocket of infection and drained the pus from it, we are leaving it open so it can continue to drain.  We will treat you with antibiotics by mouth for the next 7 days.  This is an antibiotic called Bactrim that you will take twice a day.  You should start to have improvement after 48 hours on antibiotics, if you have failure to improve after 48 hours on the antibiotics, or worsen while on antibiotics, that be a reason to come back to the emergency department, otherwise you should follow-up with your primary care doctor in 3 to 7 days for wound check.  To-Do: 1. Please follow-up with your primary doctor within 7 days / as soon as possible.   Please return to the Emergency Department or call 911 if you experience have worsening of your symptoms, or do not get better, above symptoms, chest pain, shortness of breath, severe or significantly worsening pain, high fever, severe confusion, pass out or have any reason to think that you need emergency medical care.   We hope you feel better soon.   Mitzie Later, MD Department of Emergency Medicine [facility]

## 2024-04-15 NOTE — ED Notes (Signed)
 I&D tray and Lidocaine at bedside for provider. JRPRN

## 2024-04-15 NOTE — ED Triage Notes (Signed)
 Pt ambulatory to triage with complaints of a bump to the LEFT lateral lower leg. Pt does not recall being bitten by anything. Pt states that the area has grown increasingly reddened and painful.  Pt reports that she was in [place] 2 weeks ago.

## 2024-04-15 NOTE — ED Provider Notes (Signed)
 [place] EMERGENCY DEPARTMENT AT [facility] Provider Note   CSN: [id number] Arrival date & time: 04/15/24  0715     History Chief Complaint  Patient presents with   Insect Bite    HPI: Danielle Richards is a 68 y.o. female with history pertinent for T2DM who presents complaining of area of pain and swelling on left lower leg. Patient arrived via POV.  History provided by patient.  No interpreter required during this encounter.  Patient reports that she was recently in [place] and came back on 11/17.  Reports that on 11/25 she noted a area of redness and swelling on the outer aspect of her left lower leg.  Initially she believed that it might be an ingrown hair, so she picked at it, and this caused a central scab.  Reports that since then it has become more red and inflamed.  Reports that her husband was concerned that it could be a botfly, therefore she came to the emergency department.  Denies fever, chills.  Patient's recorded medical, surgical, social, medication list and allergies were reviewed in the Snapshot window as part of the initial history.   Prior to Admission medications   Medication Sig Start Date End Date Taking? Authorizing Provider  sulfamethoxazole-trimethoprim (BACTRIM DS) 800-160 MG tablet Take 1 tablet by mouth 2 (two) times daily for 7 days. 04/15/24 04/22/24 Yes Rogelia Jerilynn RAMAN, MD  carbamide peroxide (DEBROX) 6.5 % OTIC solution Place 5 drops into both ears daily as needed. 05/13/22   Christopher Savannah, PA-C  metFORMIN (GLUCOPHAGE-XR) 500 MG 24 hr tablet Take 500 mg by mouth 2 (two) times daily. 09/07/19   [provider]  MOUNJARO  2.5 MG/0.5ML Pen Inject into the skin. 04/26/22   [provider]  neomycin -polymyxin b-dexamethasone  (MAXITROL ) 3.5-10000-0.1 OINT APPLY TO RASHES ONCE DAILY AS NEEDED 01/31/24     tirzepatide  (MOUNJARO ) 2.5 MG/0.5ML Pen Inject 2.5 mg into the skin once a week. 08/31/22   Tommas Pears, MD  tirzepatide   (MOUNJARO ) 2.5 MG/0.5ML Pen Inject 2.5 mg into the skin once a week. 04/12/23     tirzepatide  (MOUNJARO ) 2.5 MG/0.5ML Pen Inject 2.5mg  Subcutaneous once a week 01/25/24     Vitamin D, Ergocalciferol, (DRISDOL) 1.25 MG (50000 UNIT) CAPS capsule Take 50,000 Units by mouth at bedtime. 04/17/19   [provider]     Allergies: Patient has no known allergies.   Review of Systems   ROS as per HPI  Physical Exam Updated Vital Signs BP 135/77 (BP Location: Right Arm)   Pulse 78   Temp 97.7 F (36.5 C) (Oral)   Resp 16   Ht 5' 6 (1.676 m)   Wt 74.8 kg   SpO2 100%   BMI 26.63 kg/m  Physical Exam Vitals and nursing note reviewed.  Constitutional:      General: She is not in acute distress.    Appearance: Normal appearance.  HENT:     Head: Normocephalic and atraumatic.  Eyes:     Extraocular Movements: Extraocular movements intact.  Cardiovascular:     Rate and Rhythm: Normal rate.     Pulses: Normal pulses.  Pulmonary:     Effort: Pulmonary effort is normal.  Skin:    General: Skin is warm and dry.     Capillary Refill: Capillary refill takes less than 2 seconds.     Comments: Just distal to knee, patient with a area of 4 cm x 4 cm induration, erythema, warmth, tenderness to palpation with central scab,  see image.  Neurological:     General: No focal deficit present.     Mental Status: She is alert and oriented to person, place, and time.     ED Course/ Medical Decision Making/ A&P    Procedures .Incision and Drainage  Date/Time: 04/15/2024 10:19 AM  Performed by: Rogelia Jerilynn RAMAN, MD Authorized by: Rogelia Jerilynn RAMAN, MD   Consent:    Consent obtained:  Verbal   Consent given by:  Patient   Risks, benefits, and alternatives were discussed: yes     Risks discussed:  Bleeding, incomplete drainage, infection and pain   Alternatives discussed:  No treatment, observation and alternative treatment Universal protocol:    Imaging studies available: yes      Immediately prior to procedure, a time out was called: yes     Patient identity confirmed:  Verbally with patient Location:    Type:  Abscess   Size:  5mm x 5mm x 3mm   Location:  Lower extremity   Lower extremity location:  Leg   Leg location:  L lower leg Pre-procedure details:    Skin preparation:  Povidone-iodine Sedation:    Sedation type:  None Anesthesia:    Anesthesia method:  Local infiltration   Local anesthetic:  Lidocaine 2% WITH epi Procedure type:    Complexity:  Complex Procedure details:    Ultrasound guidance: yes     Needle aspiration: no     Incision types:  Single straight   Wound management:  Probed and deloculated and irrigated with saline   Drainage:  Purulent   Drainage amount:  Scant   Wound treatment:  Wound left open   Packing materials:  None Post-procedure details:    Procedure completion:  Tolerated well, no immediate complications .Ultrasound ED Soft Tissue  Date/Time: 04/15/2024 10:19 AM  Performed by: Rogelia Jerilynn RAMAN, MD Authorized by: Rogelia Jerilynn RAMAN, MD   Procedure details:    Indications: localization of abscess and evaluate for cellulitis     Transverse view:  Visualized   Longitudinal view:  Visualized   Images: archived   Location:    Location: lower extremity     Side:  Left Findings:     abscess present    cellulitis present    no foreign body present    Medications Ordered in ED Medications  lidocaine-EPINEPHrine (XYLOCAINE W/EPI) 2 %-1:200000 (PF) injection 10 mL (10 mLs Intradermal Given by Other 04/15/24 0909)  sulfamethoxazole-trimethoprim (BACTRIM DS) 800-160 MG per tablet 1 tablet (1 tablet Oral Given 04/15/24 0956)    Medical Decision Making:   Danielle Richards is a 68 y.o. female who presents for leg pain and swelling as per above.  Physical exam is pertinent for just distal to left knee, patient with a area of 4 cm x 4 cm induration, erythema, warmth, tenderness to palpation with central scab, see  image..   The differential includes but is not limited to cellulitis, abscess, insect bite.  Independent historian: None  External data reviewed: No pertinent external data  Labs: Not indicated  Radiology: Not indicated No results found.  EKG/Medicine tests: Not indicated EKG Interpretation:    Interventions: Lidocaine/epinephrine, Bactrim  See the EMR for full details regarding lab and imaging results.  Presents to the emergency department for area of redness and swelling on right lower extremity.  Reports that that this developed several days ago, is concerned that she had exposure to a botfly, because she was recently in [place], however given lack of  temporal correlation of timeframe that patient developed symptoms 5 days ago and being in [place] 2 weeks ago, feel that this is unlikely.  Patient does have clinical evidence of cellulitis on exam with induration, erythema, warmth, tenderness to palpation.  Point-of-care ultrasound obtained as per procedure note above and does demonstrate small central abscess.  Discussed risks and benefits of treatment with antibiotics versus incision and drainage given overall small abscess, patient would prefer incision and drainage, therefore this was formed as per procedure note above.  Will treat patient with course of Bactrim for purulent cellulitis.  First dose given in the ED, remainder sent to preferred pharmacy, discussed return precautions and need for follow-up with PCP for wound check.  Presentation is most consistent with acute uncomplicated illness  Discussion of management or test interpretations with external provider(s): Not indicated  Risk Drugs:Prescription drug management  Disposition: DISCHARGE: I believe that the patient is safe for discharge home with outpatient follow-up. Patient was informed of all pertinent physical exam, laboratory, and imaging findings. Patient's suspected etiology of their symptom presentation was discussed  with the patient and all questions were answered. We discussed following up with PCP. I provided thorough ED return precautions. The patient feels safe and comfortable with this plan.  MDM generated using voice dictation software and may contain dictation errors.  Please contact me for any clarification or with any questions.  Clinical Impression:  1. Abscess   2. Cellulitis of left lower extremity      Discharge   Final Clinical Impression(s) / ED Diagnoses Final diagnoses:  Abscess  Cellulitis of left lower extremity    Rx / DC Orders ED Discharge Orders          Ordered    sulfamethoxazole-trimethoprim (BACTRIM DS) 800-160 MG tablet  2 times daily        04/15/24 0944             Rogelia Jerilynn RAMAN, MD 04/15/24 1023

## 2024-05-07 ENCOUNTER — Other Ambulatory Visit (HOSPITAL_COMMUNITY): Payer: Self-pay

## 2024-05-07 ENCOUNTER — Other Ambulatory Visit: Payer: Self-pay

## 2024-06-06 ENCOUNTER — Other Ambulatory Visit (HOSPITAL_COMMUNITY): Payer: Self-pay

## 2024-06-18 ENCOUNTER — Other Ambulatory Visit (HOSPITAL_COMMUNITY): Payer: Self-pay
